# Patient Record
Sex: Female | Born: 1995 | Race: White | Hispanic: No | Marital: Single | Smoking: Never smoker
Health system: Southern US, Community
[De-identification: ages and names within clinical notes are randomized; demographics above are authoritative.]

## PROBLEM LIST (undated history)

## (undated) DIAGNOSIS — M357 Hypermobility syndrome: Secondary | ICD-10-CM

## (undated) DIAGNOSIS — M2420 Disorder of ligament, unspecified site: Secondary | ICD-10-CM

## (undated) DIAGNOSIS — J309 Allergic rhinitis, unspecified: Secondary | ICD-10-CM

## (undated) HISTORY — DX: Allergic rhinitis, unspecified: J30.9

---
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
## 2015-04-23 IMAGING — CR DG SHOULDER 2+V*L*
3 series · 3 of 3 positions shown · non-contrast
Comparison: None.

CLINICAL DATA: Left shoulder pain.

EXAM:
LEFT SHOULDER - 2+ VIEW

[view not recorded (1 of 3)]
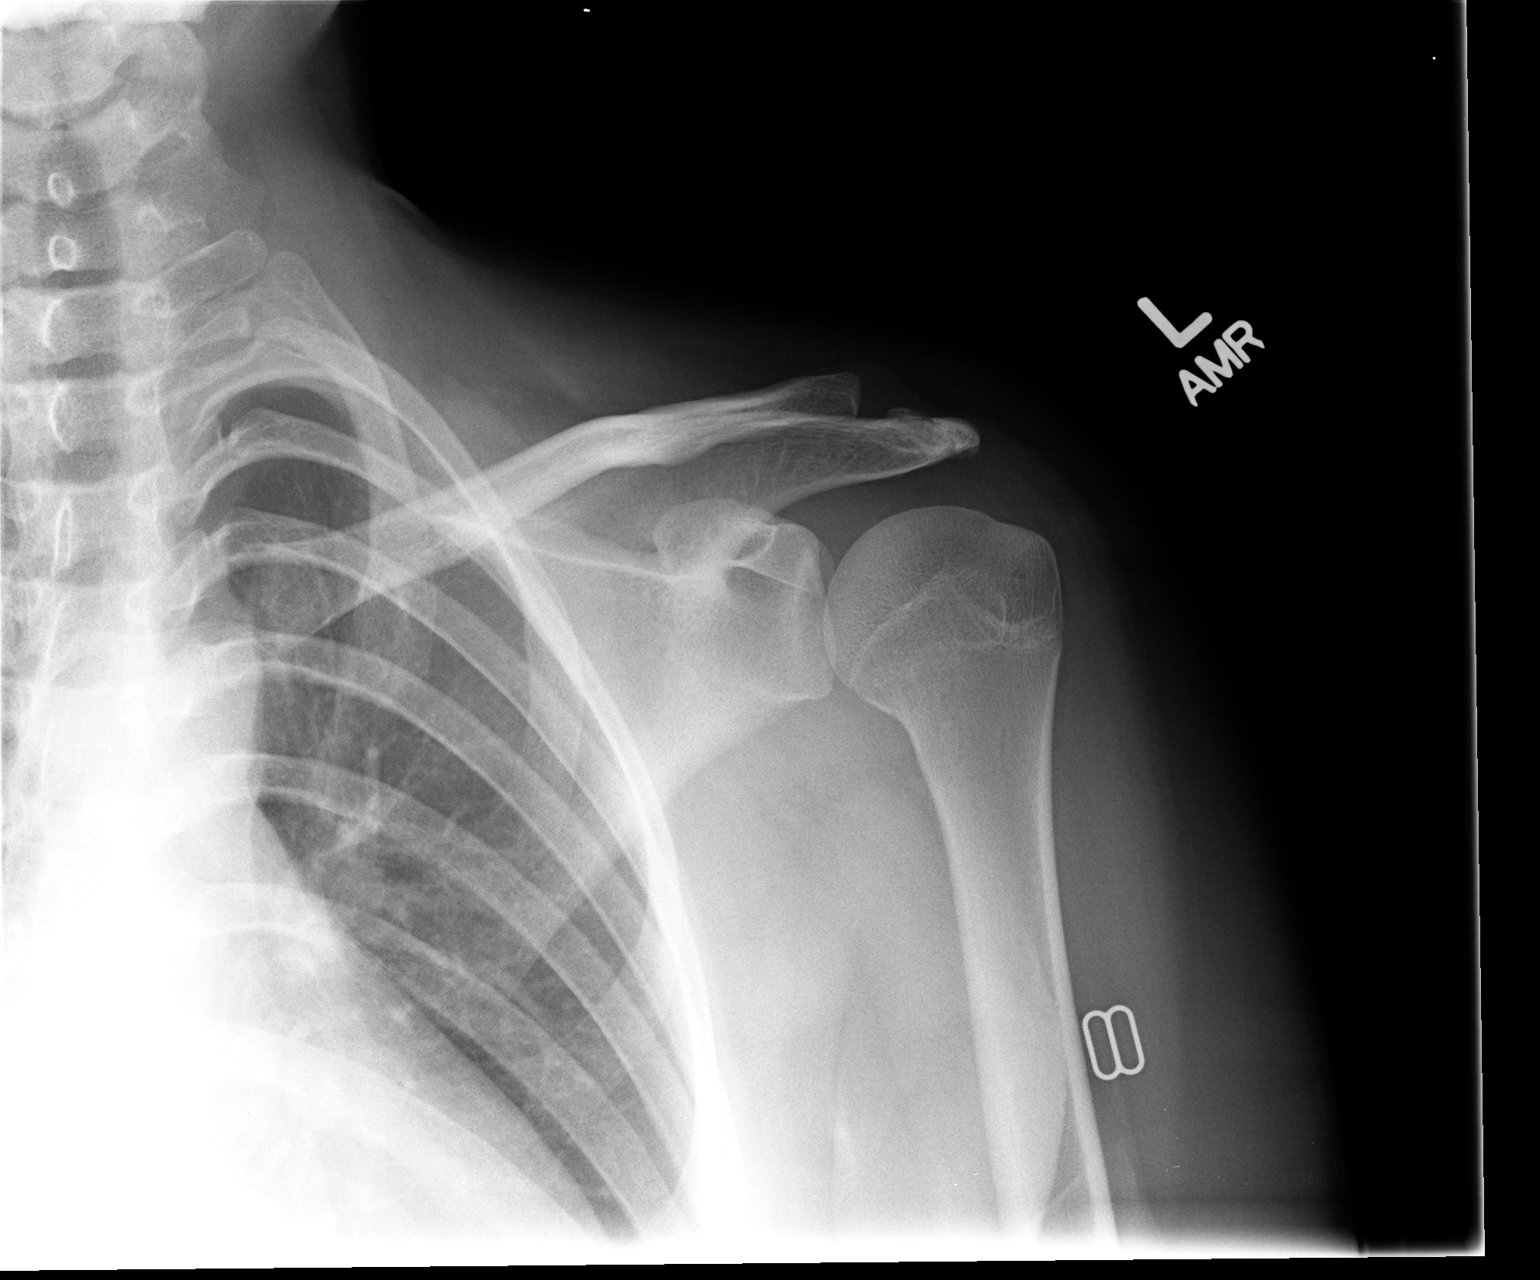

[view not recorded (2 of 3)]
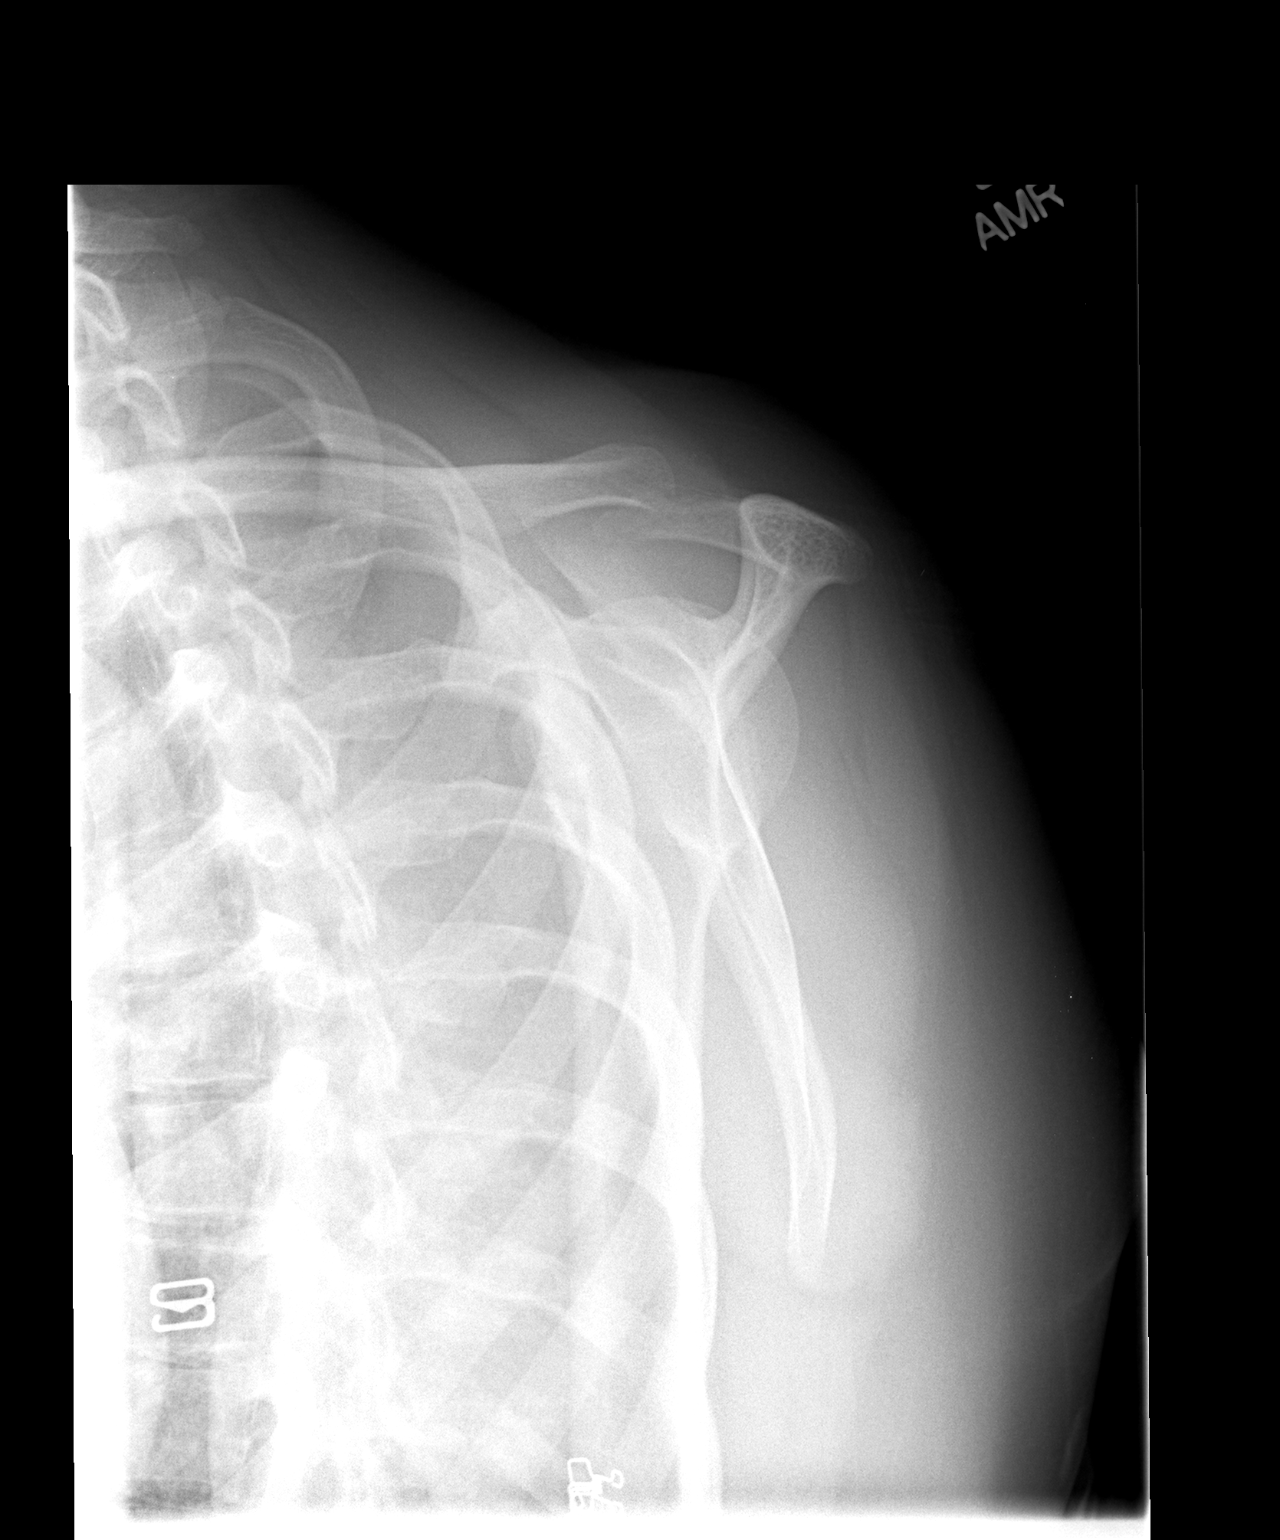

[view not recorded (3 of 3)]
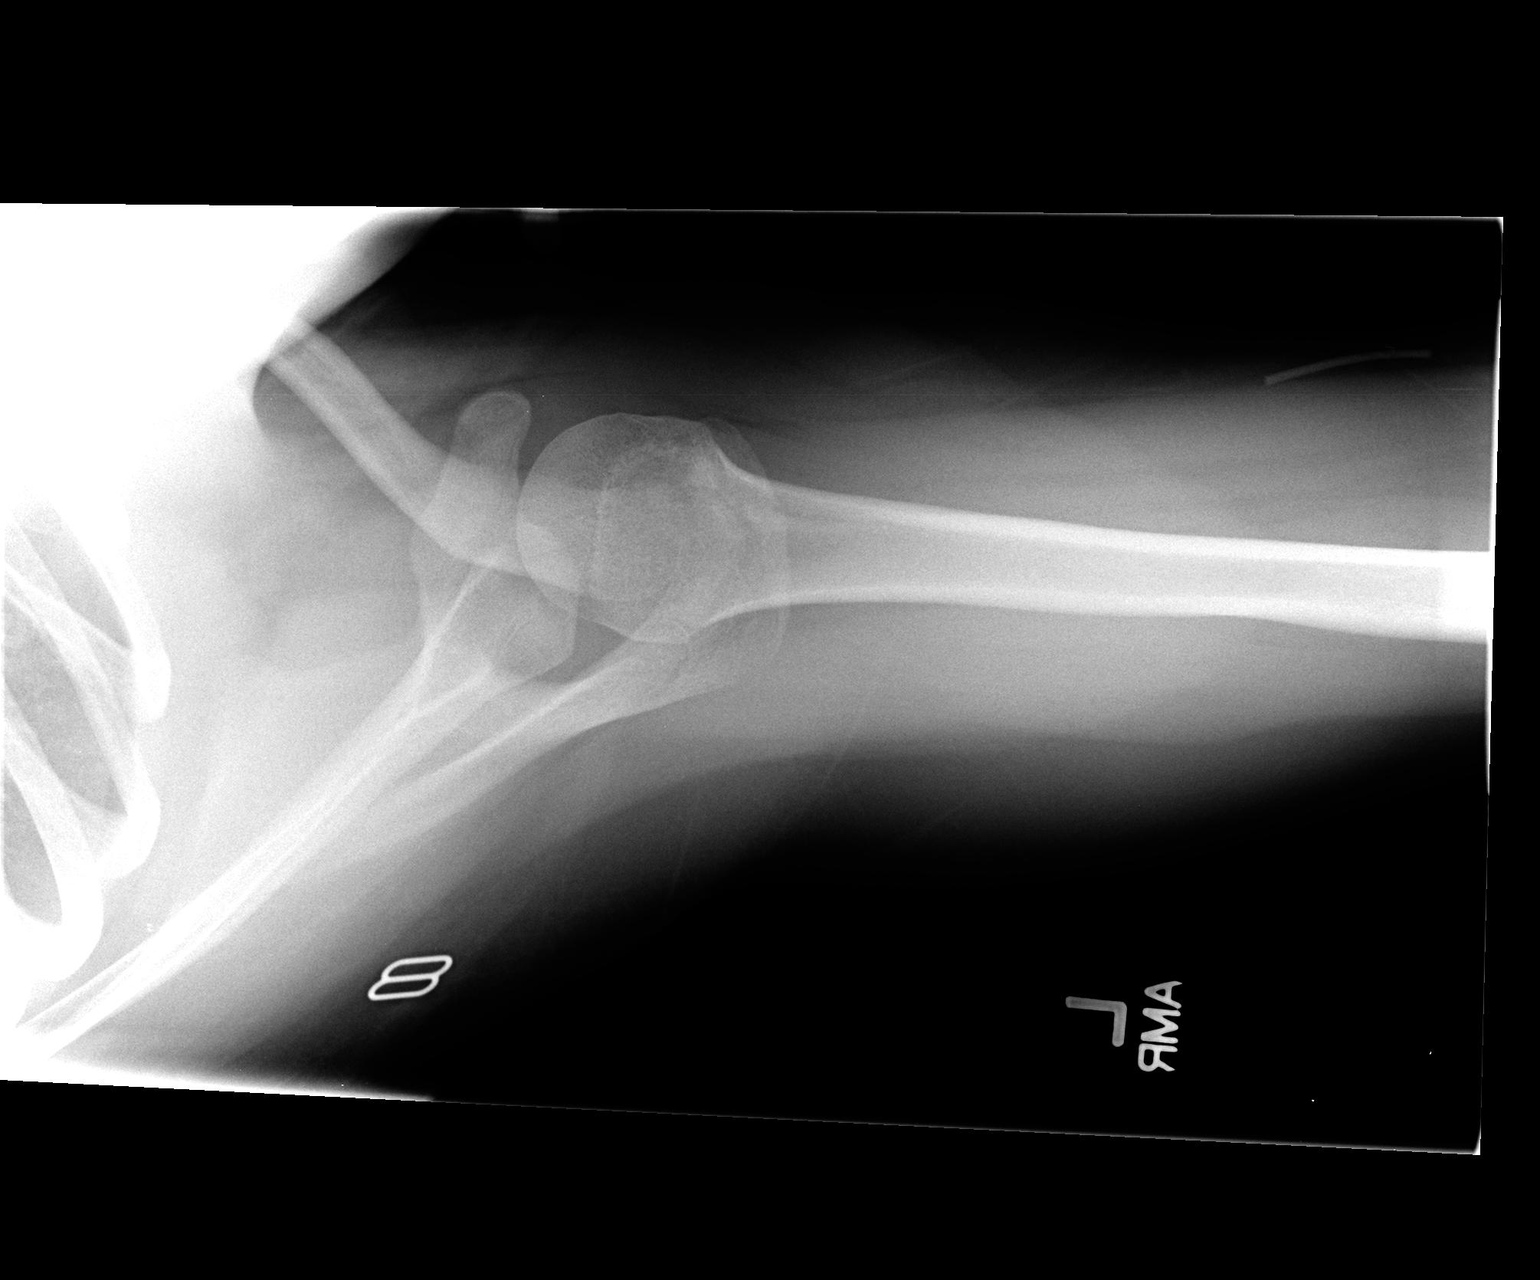

[3 of 3 positions shown; findings below may reference images not displayed]

FINDINGS: There is no evidence of fracture or dislocation. There is no
evidence of arthropathy or other focal bone abnormality. Soft
tissues are unremarkable.
IMPRESSION: Negative.

## 2015-05-03 ENCOUNTER — Telehealth: Payer: Self-pay | Admitting: Advanced Practice Midwife

## 2015-05-03 NOTE — Telephone Encounter (Signed)
Left message x 1. JSY 

## 2015-05-03 NOTE — Telephone Encounter (Signed)
Spoke with pt. Pt got Nexplanon placed 3 years ago in May. She has not had any bleeding the whole time but started bleeding today, not heavy. I advised she could schedule an appt or give it a few days to see if it stops. Pt states she has had the same sexual partner for 2 years. Pt states she may call back tomorrow and schedule an appt. JSY

## 2015-12-17 IMAGING — CR DG ANKLE COMPLETE 3+V*R*
3 series · 3 of 3 positions shown · non-contrast
Comparison: None.

CLINICAL DATA: Right lateral ankle pain

EXAM:
RIGHT ANKLE - COMPLETE 3+ VIEW

[view not recorded (1 of 3)]
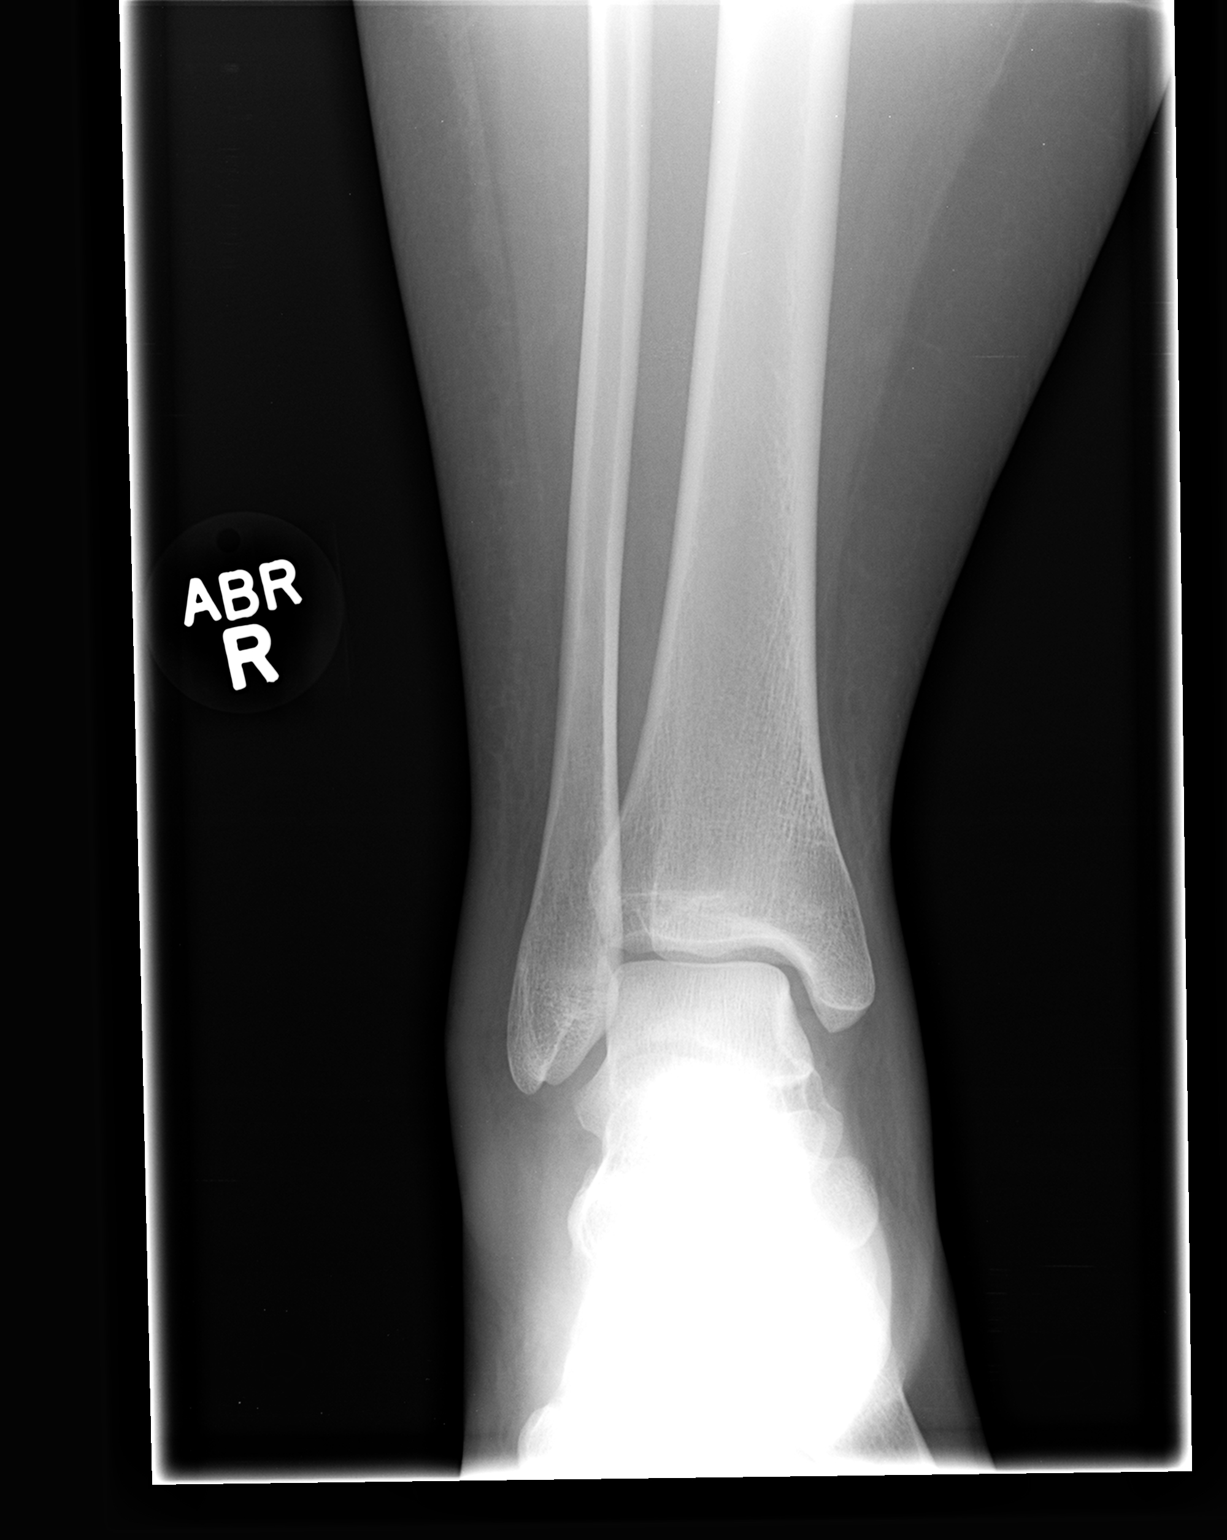

[view not recorded (2 of 3)]
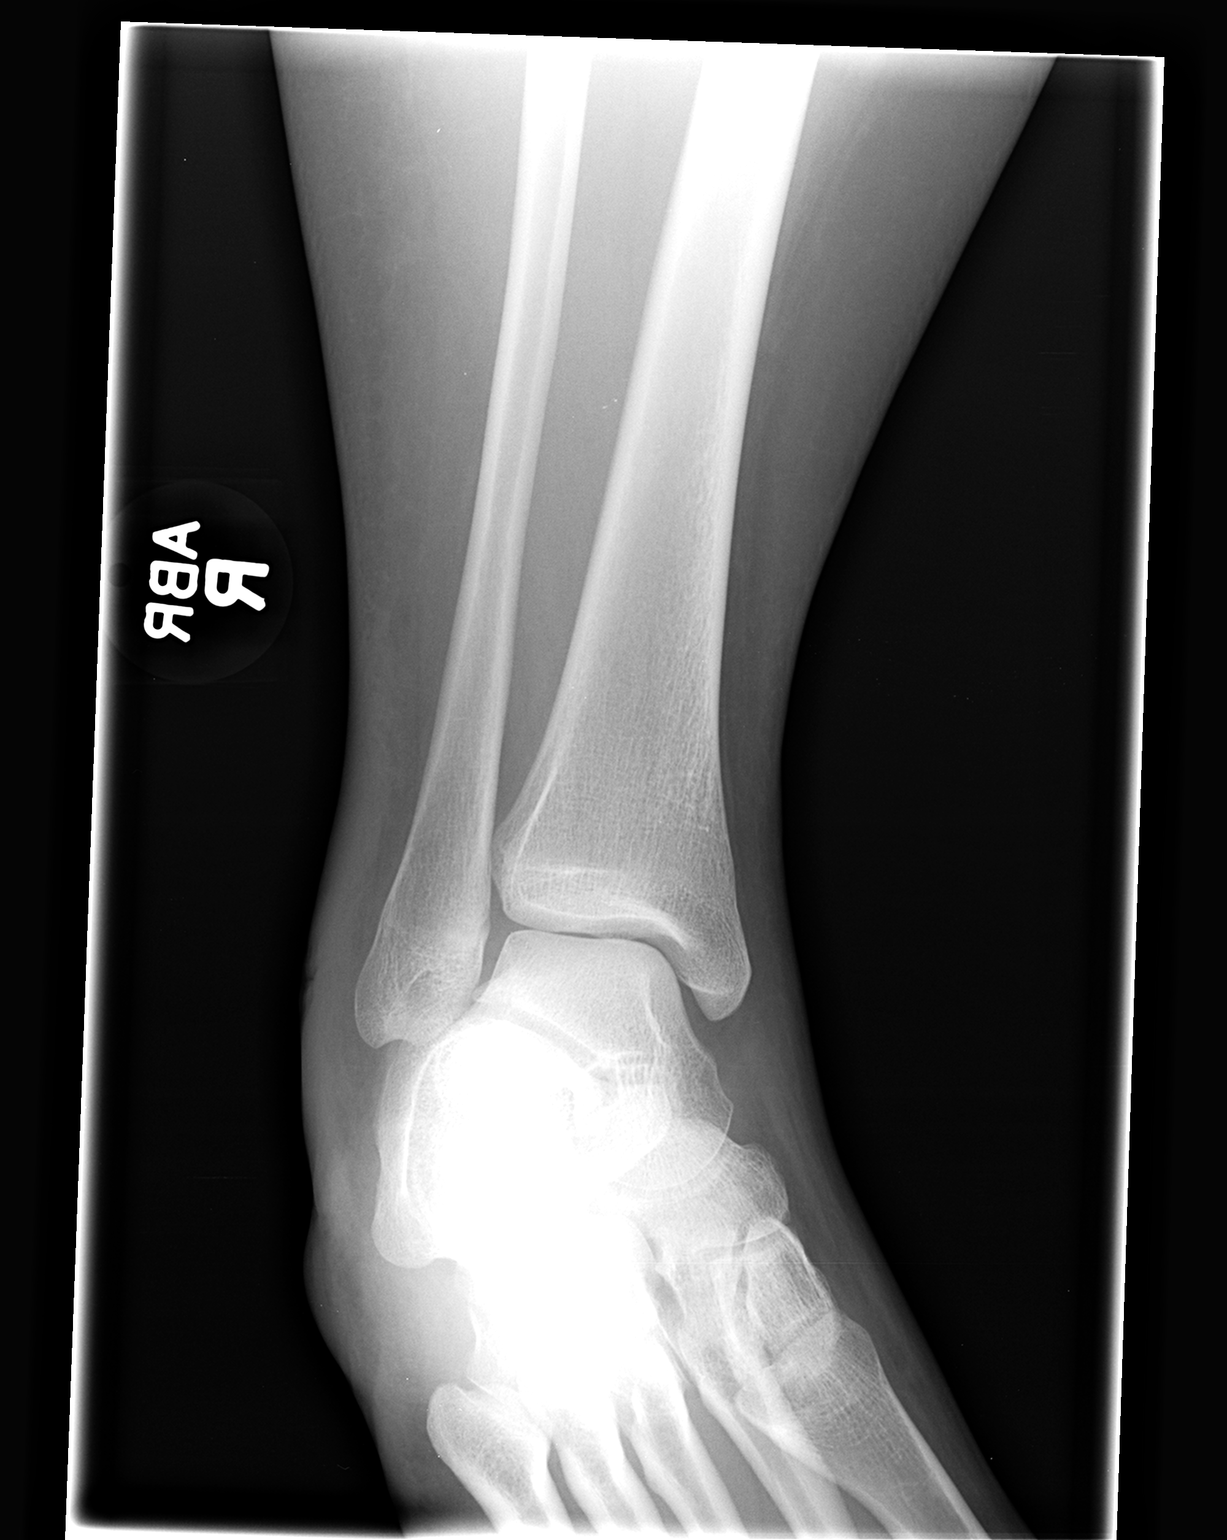

[view not recorded (3 of 3)]
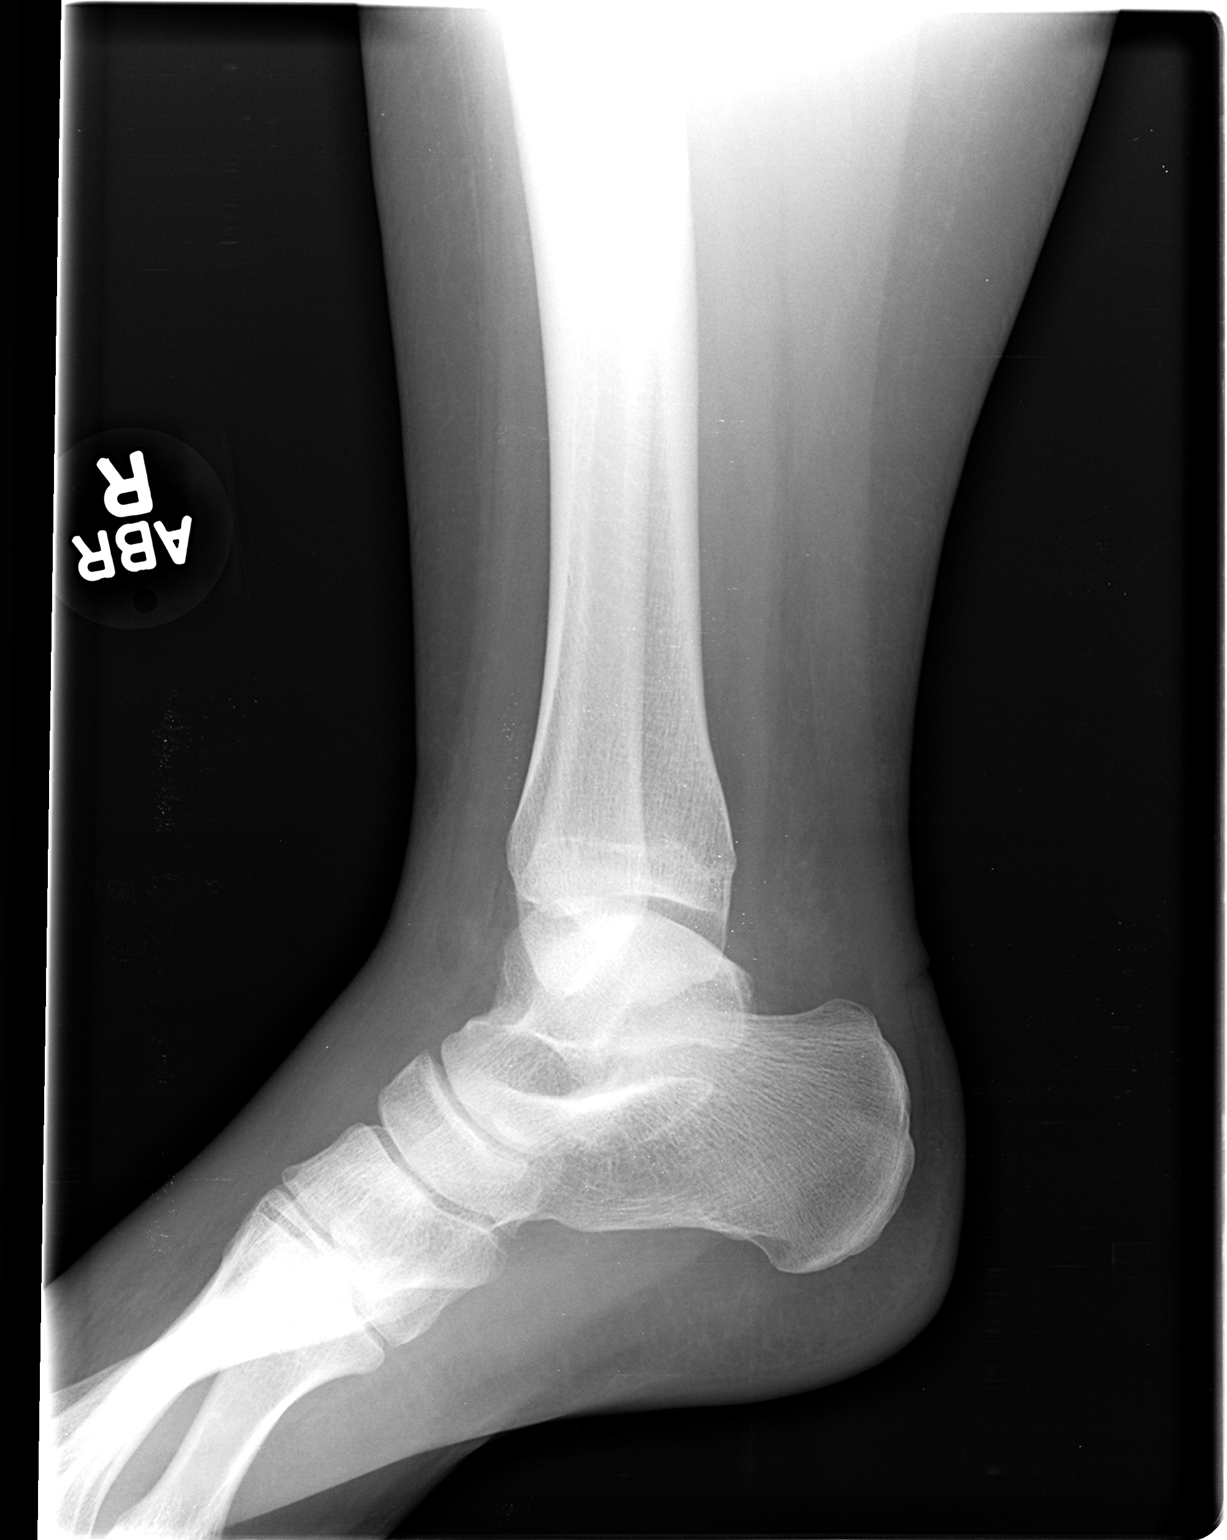

[3 of 3 positions shown; findings below may reference images not displayed]

FINDINGS: Mild soft tissue swelling is noted laterally. No acute fracture
dislocation is noted.
IMPRESSION: No acute bony abnormality noted.

## 2019-03-27 ENCOUNTER — Ambulatory Visit
Admission: EM | Admit: 2019-03-27 | Discharge: 2019-03-27 | Disposition: A | Discharge status: Home / Self Care | Payer: Medicaid Other

## 2019-03-27 ENCOUNTER — Other Ambulatory Visit: Payer: Self-pay

## 2019-03-27 DIAGNOSIS — Z3A16 16 weeks gestation of pregnancy: Secondary | ICD-10-CM

## 2019-03-27 DIAGNOSIS — Z20822 Contact with and (suspected) exposure to covid-19: Secondary | ICD-10-CM

## 2019-03-27 MED ORDER — CETIRIZINE HCL 10 MG PO TABS: 10.0000 mg | ORAL_TABLET | Freq: Every day | ORAL | 0 refills | Status: AC

## 2019-03-27 NOTE — ED Provider Notes (Signed)
[facility]   [id number] 03/27/19 Arrival Time: 0945   CC: COVID symptoms  SUBJECTIVE: History from: patient.  LATONIA CONROW is a 24 y.o. female hx significant for [redacted] weeks pregnant, who presents with congestion, left sided sinus pain and pressure x 1 day.  Mother tested positive for COVID.  Last exposure 1 week ago.  Denies recent travel.  Has NOT tried OTC medications.  Denies aggravating factors.  Reports previous symptoms in the past related to sinus infection and flu.   Complains of fatigue, and mild cough as well. Also mentions an episode of nausea and vomiting a few days ago.  Denies fever, chills, rhinorrhea, sore throat, SOB, wheezing, chest pain, changes in bowel or bladder habits.    ROS: As per HPI.  All other pertinent ROS negative.     Past Medical History:  Diagnosis Date  . Allergic rhinitis   . Hypermobility syndrome   . Ligament laxity    misearable syndrome   History reviewed. No pertinent surgical history. No Known Allergies No current facility-administered medications on file prior to encounter.   Current Outpatient Medications on File Prior to Encounter  Medication Sig Dispense Refill  . Prenatal Vit-Fe Fumarate-FA (MULTIVITAMIN-PRENATAL) 27-0.8 MG TABS tablet Take 1 tablet by mouth daily at 12 noon.    Marland Kitchen ibuprofen (ADVIL,MOTRIN) 600 MG tablet Take 600 mg by mouth 3 (three) times daily as needed for moderate pain.     . [DISCONTINUED] etonogestrel (NEXPLANON) 68 MG IMPL implant Inject 1 each into the skin once.     Social History   Socioeconomic History  . Marital status: Single    Spouse name: Not on file  . Number of children: Not on file  . Years of education: Not on file  . Highest education level: Not on file  Occupational History  . Not on file  Tobacco Use  . Smoking status: Never Smoker  . Smokeless tobacco: Never Used  Substance and Sexual Activity  . Alcohol use: No  . Drug use: No  . Sexual activity: Never    Birth  control/protection: Implant  Other Topics Concern  . Not on file  Social History Narrative  . Not on file   Social Determinants of Health   Financial Resource Strain:   . Difficulty of Paying Living Expenses: Not on file  Food Insecurity:   . Worried About Charity fundraiser in the Last Year: Not on file  . Ran Out of Food in the Last Year: Not on file  Transportation Needs:   . Lack of Transportation (Medical): Not on file  . Lack of Transportation (Non-Medical): Not on file  Physical Activity:   . Days of Exercise per Week: Not on file  . Minutes of Exercise per Session: Not on file  Stress:   . Feeling of Stress : Not on file  Social Connections:   . Frequency of Communication with Friends and Family: Not on file  . Frequency of Social Gatherings with Friends and Family: Not on file  . Attends Religious Services: Not on file  . Active Member of Clubs or Organizations: Not on file  . Attends Archivist Meetings: Not on file  . Marital Status: Not on file  Intimate Partner Violence:   . Fear of Current or Ex-Partner: Not on file  . Emotionally Abused: Not on file  . Physically Abused: Not on file  . Sexually Abused: Not on file   Family History  Problem Relation Age  of Onset  . Diabetes Maternal Grandfather   . Healthy Mother   . Healthy Father     OBJECTIVE:  Vitals:   03/27/19 1006  BP: 129/80  Pulse: 88  Resp: 18  Temp: 98.3 F (36.8 C)  TempSrc: Oral  SpO2: 97%     General appearance: alert; appears mildly fatigued, but nontoxic; speaking in full sentences and tolerating own secretions HEENT: NCAT, healing burn localized to forehead, no erythema discharge or odor; Ears: EACs clear, TMs pearly gray; Eyes: PERRL.  EOM grossly intact. Sinuses: nontender; Nose: nares patent without rhinorrhea, Throat: oropharynx clear, tonsils non erythematous or enlarged, uvula midline  Neck: supple without LAD Lungs: unlabored respirations, symmetrical air entry;  cough: absent; no respiratory distress; CTAB Heart: regular rate and rhythm.   Skin: warm and dry Psychological: alert and cooperative; normal mood and affect  ASSESSMENT & PLAN:  1. Suspected COVID-19 virus infection   2. Exposure to COVID-19 virus   3. [redacted] weeks gestation of pregnancy     Meds ordered this encounter  Medications  . cetirizine (ZYRTEC) 10 MG tablet    Sig: Take 1 tablet (10 mg total) by mouth daily.    Dispense:  30 tablet    Refill:  0    Order Specific Question:   Supervising Provider    Answer:   Eustace Moore [[id number]]   COVID testing ordered.  It will take between 5-7 days for test results.  Someone will contact you regarding abnormal results.    In the meantime: You should remain isolated in your home for 10 days from symptom onset AND greater than 72 hours after symptoms resolution (absence of fever without the use of fever-reducing medication and improvement in respiratory symptoms), whichever is longer Get plenty of rest and push fluids Use OTC zyrtec for nasal congestion, runny nose, and/or sore throat Use medications daily for symptom relief Use OTC medications like ibuprofen or tylenol as needed fever or pain Follow up with OB/PCP via video or telephone visit with test results and for further evaluation and management Call or go to the ED if you have any new or worsening symptoms such as fever, worsening cough, shortness of breath, chest tightness, chest pain, turning blue, changes in mental status, etc...    Reviewed expectations re: course of current medical issues. Questions answered. Outlined signs and symptoms indicating need for more acute intervention. Patient verbalized understanding. After Visit Summary given.         Rennis Harding, PA-C 03/27/19 1034

## 2019-03-27 NOTE — ED Triage Notes (Signed)
Pt presents to UC w/ c/o cough and congestion, pressure on left side of head since yesterday.  Pt states she has been exposed to her covid positive mother.

## 2019-03-27 NOTE — Discharge Instructions (Addendum)
COVID testing ordered.  It will take between 5-7 days for test results.  Someone will contact you regarding abnormal results.    In the meantime: You should remain isolated in your home for 10 days from symptom onset AND greater than 72 hours after symptoms resolution (absence of fever without the use of fever-reducing medication and improvement in respiratory symptoms), whichever is longer Get plenty of rest and push fluids Use OTC zyrtec for nasal congestion, runny nose, and/or sore throat Use medications daily for symptom relief Use OTC medications like ibuprofen or tylenol as needed fever or pain Follow up with OB/PCP via video or telephone visit with test results and for further evaluation and management Call or go to the ED if you have any new or worsening symptoms such as fever, worsening cough, shortness of breath, chest tightness, chest pain, turning blue, changes in mental status, etc..Marland Kitchen

## 2019-03-28 LAB — NOVEL CORONAVIRUS, NAA: SARS-CoV-2, NAA: DETECTED — AB

## 2019-03-31 ENCOUNTER — Telehealth (HOSPITAL_COMMUNITY): Payer: Self-pay | Admitting: Emergency Medicine

## 2019-03-31 NOTE — Telephone Encounter (Signed)

## 2019-04-05 ENCOUNTER — Ambulatory Visit
Admission: EM | Admit: 2019-04-05 | Discharge: 2019-04-05 | Disposition: A | Discharge status: Home / Self Care | Payer: Medicaid Other
